# Patient Record
Sex: Female | Born: 1980
Health system: Southern US, Community
[De-identification: ages and names within clinical notes are randomized; demographics above are authoritative.]

## PROBLEM LIST (undated history)

## (undated) HISTORY — PX: DILATION AND CURETTAGE OF UTERUS: SHX78

---

## 2015-03-29 ENCOUNTER — Encounter: Payer: Self-pay | Admitting: *Deleted

## 2015-03-29 ENCOUNTER — Emergency Department
Admission: EM | Admit: 2015-03-29 | Discharge: 2015-03-29 | Disposition: A | Discharge status: Home / Self Care | Payer: 59 | Source: Home / Self Care | Attending: Family Medicine | Admitting: Family Medicine

## 2015-03-29 DIAGNOSIS — J069 Acute upper respiratory infection, unspecified: Secondary | ICD-10-CM

## 2015-03-29 DIAGNOSIS — B9789 Other viral agents as the cause of diseases classified elsewhere: Principal | ICD-10-CM

## 2015-03-29 NOTE — Discharge Instructions (Signed)
Take plain guaifenesin (1200mg extended release tabs such as Mucinex) twice daily, with plenty of water, for cough and congestion.  May add Pseudoephedrine (30mg, one or two every 4 to 6 hours) for sinus congestion.  Get adequate rest.   °May use Afrin nasal spray (or generic oxymetazoline) twice daily for about 5 days and then discontinue.  Also recommend using saline nasal spray several times daily and saline nasal irrigation (AYR is a common brand).  Use Flonase nasal spray each morning after using Afrin nasal spray and saline nasal irrigation. °Try warm salt water gargles for sore throat.  °Stop all antihistamines for now, and other non-prescription cough/cold preparations. °May take Ibuprofen 200mg, 4 tabs every 8 hours with food for chest/sternum discomfort, body aches, etc. °May take Delsym Cough Suppressant at bedtime for nighttime cough.  °Follow-up with family doctor if not improving about10 days.  °

## 2015-03-29 NOTE — ED Notes (Addendum)
Pt c/o 4-5 days of fever, aches sore throat, cough and sneezing. T-max 102. Rec'd flu vac.

## 2015-03-29 NOTE — ED Provider Notes (Signed)
CSN: [id number]     Arrival date & time 03/29/15  1039 History   First MD Initiated Contact with Patient 03/29/15 1144     Chief Complaint  Patient presents with  . Generalized Body Aches  . Fever      HPI Comments: Patient complains of three day history of typical cold-like symptoms including mild sore throat, fever, sinus congestion, headache, fatigue, and cough.   The history is provided by the patient.    History reviewed. No pertinent past medical history. History reviewed. No pertinent past surgical history. Family History  Problem Relation Age of Onset  . Cancer Other   . Diabetes Other    Social History  Substance Use Topics  . Smoking status: Never Smoker   . Smokeless tobacco: Never Used  . Alcohol Use: No   OB History    No data available     Review of Systems + sore throat + cough + sneezing No pleuritic pain No wheezing + nasal congestion + post-nasal drainage No sinus pain/pressure No itchy/red eyes No earache No hemoptysis No SOB + fever, + chills/sweats No nausea No vomiting No abdominal pain No diarrhea No urinary symptoms No skin rash + fatigue + myalgias + headache Used OTC meds without relief  Allergies  Review of patient's allergies indicates no known allergies.  Home Medications   Prior to Admission medications   Not on File   Meds Ordered and Administered this Visit  Medications - No data to display  BP 110/72 mmHg  Pulse 90  Temp(Src) 98.4 F (36.9 C) (Oral)  Resp 18  Wt 245 lb (111.131 kg)  SpO2 98%  LMP 03/15/2015 No data found.   Physical Exam Nursing notes and Vital Signs reviewed. Appearance:  Patient appears stated age, and in no acute distress Eyes:  Pupils are equal, round, and reactive to light and accomodation.  Extraocular movement is intact.  Conjunctivae are not inflamed  Ears:  Canals normal.  Tympanic membranes normal.  Nose:  Congested turbinates.  No sinus tenderness.   Pharynx:  Normal Neck:   Supple.  Tender enlarged posterior nodes are palpated bilaterally  Lungs:  Clear to auscultation.  Breath sounds are equal.  Moving air well.  Chest:  Distinct tenderness to palpation over the mid-sternum.  Heart:  Regular rate and rhythm without murmurs, rubs, or gallops.  Abdomen:  Nontender without masses or hepatosplenomegaly.  Bowel sounds are present.  No CVA or flank tenderness.  Extremities:  No edema.  Skin:  No rash present.   ED Course  Procedures  None    MDM   1. Viral URI with cough    There is no evidence of bacterial infection today.  Treat symptomatically for now  Take plain guaifenesin (1200mg  extended release tabs such as Mucinex) twice daily, with plenty of water, for cough and congestion.  May add Pseudoephedrine (30mg , one or two every 4 to 6 hours) for sinus congestion.  Get adequate rest.   May use Afrin nasal spray (or generic oxymetazoline) twice daily for about 5 days and then discontinue.  Also recommend using saline nasal spray several times daily and saline nasal irrigation (AYR is a common brand).  Use Flonase nasal spray each morning after using Afrin nasal spray and saline nasal irrigation. Try warm salt water gargles for sore throat.  Stop all antihistamines for now, and other non-prescription cough/cold preparations. May take Ibuprofen 200mg , 4 tabs every 8 hours with food for chest/sternum discomfort, body aches, etc.  May take Delsym Cough Suppressant at bedtime for nighttime cough.  Follow-up with family doctor if not improving about10 days.    Lattie Haw, MD 04/02/15 [phone]

## 2015-04-03 ENCOUNTER — Telehealth: Payer: 59 | Admitting: Nurse Practitioner

## 2015-04-03 DIAGNOSIS — J0101 Acute recurrent maxillary sinusitis: Secondary | ICD-10-CM | POA: Diagnosis not present

## 2015-04-03 MED ORDER — AZITHROMYCIN 250 MG PO TABS: ORAL_TABLET | ORAL | Status: DC

## 2015-04-03 MED FILL — AZITHROMYCIN 250 MG TABLET: 250 | 5 days supply | Qty: 6 | Fill #0

## 2015-04-03 NOTE — Progress Notes (Signed)

## 2015-07-30 ENCOUNTER — Emergency Department (INDEPENDENT_AMBULATORY_CARE_PROVIDER_SITE_OTHER): Payer: 59

## 2015-07-30 ENCOUNTER — Encounter: Payer: Self-pay | Admitting: *Deleted

## 2015-07-30 ENCOUNTER — Emergency Department
Admission: EM | Admit: 2015-07-30 | Discharge: 2015-07-30 | Disposition: A | Discharge status: Home / Self Care | Payer: 59 | Source: Home / Self Care | Attending: Emergency Medicine | Admitting: Emergency Medicine

## 2015-07-30 DIAGNOSIS — J040 Acute laryngitis: Secondary | ICD-10-CM | POA: Diagnosis not present

## 2015-07-30 DIAGNOSIS — R0602 Shortness of breath: Secondary | ICD-10-CM | POA: Diagnosis not present

## 2015-07-30 DIAGNOSIS — R05 Cough: Secondary | ICD-10-CM

## 2015-07-30 DIAGNOSIS — R062 Wheezing: Secondary | ICD-10-CM | POA: Diagnosis not present

## 2015-07-30 DIAGNOSIS — J208 Acute bronchitis due to other specified organisms: Secondary | ICD-10-CM

## 2015-07-30 MED ORDER — PREDNISONE 10 MG PO TABS: ORAL_TABLET | ORAL | Status: DC

## 2015-07-30 MED ORDER — AZITHROMYCIN 250 MG PO TABS
250.0000 mg | ORAL_TABLET | Freq: Every day | ORAL | Status: DC
Start: 2015-07-30 — End: 2015-12-19

## 2015-07-30 NOTE — Discharge Instructions (Signed)
Laryngitis  Laryngitis is inflammation of your vocal cords. This causes hoarseness, coughing, loss of voice, sore throat, or a dry throat. Your vocal cords are two bands of muscles that are found in your throat. When you speak, these cords come together and vibrate. These vibrations come out through your mouth as sound. When your vocal cords are inflamed, your voice sounds different.  Laryngitis can be temporary (acute) or long-term (chronic). Most cases of acute laryngitis improve with time. Chronic laryngitis is laryngitis that lasts for more than three weeks.  CAUSES  Acute laryngitis may be caused by:   A viral infection.   Lots of talking, yelling, or singing. This is also called vocal strain.   Bacterial infections.  Chronic laryngitis may be caused by:   Vocal strain.   Injury to your vocal cords.   Acid reflux (gastroesophageal reflux disease or GERD).   Allergies.   Sinus infection.   Smoking.   Alcohol abuse.   Breathing in chemicals or dust.   Growths on the vocal cords.  RISK FACTORS  Risk factors for laryngitis include:   Smoking.   Alcohol abuse.   Having allergies.  SIGNS AND SYMPTOMS  Symptoms of laryngitis may include:   Low, hoarse voice.   Loss of voice.   Dry cough.   Sore throat.   Stuffy nose.  DIAGNOSIS  Laryngitis may be diagnosed by:   Physical exam.   Throat culture.   Blood test.   Laryngoscopy. This procedure allows your health care provider to look at your vocal cords with a mirror or viewing tube.  TREATMENT  Treatment for laryngitis depends on what is causing it. Usually, treatment involves resting your voice and using medicines to soothe your throat. However, if your laryngitis is caused by a bacterial infection, you may need to take antibiotic medicine. If your laryngitis is caused by a growth, you may need to have a procedure to remove it.  HOME CARE INSTRUCTIONS   Drink enough fluid to keep your urine clear or pale yellow.   Breathe in moist air. Use a  humidifier if you live in a dry climate.   Take medicines only as directed by your health care provider.   If you were prescribed an antibiotic medicine, finish it all even if you start to feel better.   Do not smoke cigarettes or electronic cigarettes. If you need help quitting, ask your health care provider.   Talk as little as possible. Also avoid whispering, which can cause vocal strain.   Write instead of talking. Do this until your voice is back to normal.  SEEK MEDICAL CARE IF:   You have a fever.   You have increasing pain.   You have difficulty swallowing.  SEEK IMMEDIATE MEDICAL CARE IF:   You cough up blood.   You have trouble breathing.     This information is not intended to replace advice given to you by your health care provider. Make sure you discuss any questions you have with your health care provider.     Document Released: 03/09/2005 Document Revised: 03/30/2014 Document Reviewed: 08/22/2013  Elsevier Interactive Patient Education 2016 [employer]

## 2015-07-30 NOTE — ED Notes (Signed)
Pt c/o 2 weeks of SOB with exertion, 2-3 days of cough which is now productive. Afebrile. Taken [employer]

## 2015-07-31 NOTE — ED Provider Notes (Signed)
CSN: [id number]     Arrival date & time 07/30/15  1438 History   First MD Initiated Contact with Patient 07/30/15 1514     Chief Complaint  Patient presents with  . Shortness of Breath  . Cough   (Consider location/radiation/quality/duration/timing/severity/associated sxs/prior Treatment) Patient is a 35 y.o. female presenting with shortness of breath and cough. The history is provided by the patient. No language interpreter was used.  Shortness of Breath Severity:  Moderate Onset quality:  Gradual Duration:  2 weeks Timing:  Constant Progression:  Worsening Chronicity:  New Context: activity   Relieved by:  Nothing Worsened by:  Nothing tried Ineffective treatments:  None tried Associated symptoms: cough   Risk factors: no hx of PE/DVT   Cough Associated symptoms: shortness of breath     History reviewed. No pertinent past medical history. History reviewed. No pertinent past surgical history. Family History  Problem Relation Age of Onset  . Cancer Other   . Diabetes Other   . Diabetes Mother    Social History  Substance Use Topics  . Smoking status: Never Smoker   . Smokeless tobacco: Never Used  . Alcohol Use: No   OB History    No data available     Review of Systems  Respiratory: Positive for cough and shortness of breath.   All other systems reviewed and are negative.   Allergies  Review of patient's allergies indicates no known allergies.  Home Medications   Prior to Admission medications   Medication Sig Start Date End Date Taking? Authorizing Provider  azithromycin (ZITHROMAX) 250 MG tablet Take 1 tablet (250 mg total) by mouth daily. Take first 2 tablets together, then 1 every day until finished. 07/30/15   Elson AreasLeslie K Summer Parthasarathy, PA-C  predniSONE (DELTASONE) 10 MG tablet 4,0,9,8,1,16,5,4,3,2,1 taper 07/30/15   Elson AreasLeslie K Arielys [place], PA-C   Meds Ordered and Administered this Visit  Medications - No data to display  BP 104/71 mmHg  Pulse 89  Temp(Src) 98.4 F (36.9 C)  (Oral)  Resp 16  Wt 245 lb (111.131 kg)  SpO2 98%  LMP 07/24/2015 No data found.   Physical Exam  Constitutional: She is oriented to person, place, and time. She appears well-developed and well-nourished.  HENT:  Head: Normocephalic.  Eyes: EOM are normal. Pupils are equal, round, and reactive to light.  Neck: Normal range of motion.  Cardiovascular: Normal rate.   Pulmonary/Chest: Effort normal.  Abdominal: Soft. She exhibits no distension.  Musculoskeletal: Normal range of motion.  Neurological: She is alert and oriented to person, place, and time.  Skin: Skin is warm.  Psychiatric: She has a normal mood and affect.  Nursing note and vitals reviewed.   ED Course  Procedures (including critical care time)  Labs Review Labs Reviewed - No data to display  Imaging Review Dg Chest 2 View  07/30/2015  CLINICAL DATA:  Shortness of breath for 2 weeks. Hoarseness and cough for 2 days. Initial encounter. EXAM: CHEST  2 VIEW COMPARISON:  None. FINDINGS: The lungs are clear. Heart size is normal. No pneumothorax pleural effusion. No focal bony abnormality. IMPRESSION: Negative chest. Electronically Signed   By: Drusilla Kannerhomas  Dalessio M.D.   On: 07/30/2015 15:43     Visual Acuity Review  Right Eye Distance:   Left Eye Distance:   Bilateral Distance:    Right Eye Near:   Left Eye Near:    Bilateral Near:         MDM  Chest xray no pneumonia.  I will treat for bronchitis.  Pt given rx for zithromax and prednisone.   Pt is PERC negative.  I doubt cardiac etiology.  No pe risk.  Pt has raspy voice,  Laryngitis probably due to cough and drainage   1. Laryngitis   2. Acute bronchitis due to other specified organisms     Meds ordered this encounter  Medications  . azithromycin (ZITHROMAX) 250 MG tablet    Sig: Take 1 tablet (250 mg total) by mouth daily. Take first 2 tablets together, then 1 every day until finished.    Dispense:  6 tablet    Refill:  0    Order Specific  Question:  Supervising Provider    Answer:  Georgina Pillion, DAVID [[id number]]  . predniSONE (DELTASONE) 10 MG tablet    Sig: 6,5,4,3,2,1 taper    Dispense:  21 tablet    Refill:  0    Order Specific Question:  Supervising Provider    Answer:  Georgina Pillion, DAVID [[id number]]   An After Visit Summary was printed and given to the patient.   Lonia Skinner [place], PA-C 07/31/15 1836

## 2015-11-26 DIAGNOSIS — H31092 Other chorioretinal scars, left eye: Secondary | ICD-10-CM | POA: Diagnosis not present

## 2015-12-19 ENCOUNTER — Emergency Department
Admission: EM | Admit: 2015-12-19 | Discharge: 2015-12-19 | Disposition: A | Discharge status: Home / Self Care | Payer: 59 | Source: Home / Self Care | Attending: Emergency Medicine | Admitting: Emergency Medicine

## 2015-12-19 ENCOUNTER — Encounter: Payer: Self-pay | Admitting: *Deleted

## 2015-12-19 DIAGNOSIS — B001 Herpesviral vesicular dermatitis: Secondary | ICD-10-CM

## 2015-12-19 DIAGNOSIS — B002 Herpesviral gingivostomatitis and pharyngotonsillitis: Secondary | ICD-10-CM | POA: Diagnosis not present

## 2015-12-19 MED ORDER — VALACYCLOVIR HCL 1 G PO TABS
2000.0000 mg | ORAL_TABLET | Freq: Two times a day (BID) | ORAL | 1 refills | Status: AC
Start: 2015-12-19 — End: ?

## 2015-12-19 NOTE — ED Triage Notes (Signed)
Pt c/o ulcer to right lower gumline x 4-5 days. Pain is starting to radiates down face and into neck. Feels lethargic but otherwise asymptomatic.

## 2015-12-23 DIAGNOSIS — F458 Other somatoform disorders: Secondary | ICD-10-CM | POA: Diagnosis not present

## 2015-12-23 DIAGNOSIS — M2550 Pain in unspecified joint: Secondary | ICD-10-CM | POA: Diagnosis not present

## 2015-12-23 DIAGNOSIS — R748 Abnormal levels of other serum enzymes: Secondary | ICD-10-CM | POA: Diagnosis not present

## 2015-12-23 DIAGNOSIS — R202 Paresthesia of skin: Secondary | ICD-10-CM | POA: Diagnosis not present

## 2015-12-23 DIAGNOSIS — M791 Myalgia: Secondary | ICD-10-CM | POA: Diagnosis not present

## 2015-12-23 DIAGNOSIS — H04129 Dry eye syndrome of unspecified lacrimal gland: Secondary | ICD-10-CM | POA: Diagnosis not present

## 2015-12-23 DIAGNOSIS — Z6841 Body Mass Index (BMI) 40.0 and over, adult: Secondary | ICD-10-CM | POA: Diagnosis not present

## 2015-12-23 DIAGNOSIS — R682 Dry mouth, unspecified: Secondary | ICD-10-CM | POA: Diagnosis not present

## 2015-12-23 DIAGNOSIS — E6609 Other obesity due to excess calories: Secondary | ICD-10-CM | POA: Diagnosis not present

## 2015-12-24 NOTE — ED Provider Notes (Signed)
[facility]    CSN: [id number] Arrival date & time: 12/19/15  1017     History   Chief Complaint Chief Complaint  Patient presents with  . Mouth Lesions    HPI Suzanne Clark is a 35 y.o. female.   HPI Five days  Moderate, lancing  Pain R lower gumline, Radiates to face and right side of neck.History of cold sores in the past, and this feels like a cold sore in the right lower gumline. Otherwise, no sore throat,coryza, fever,chills,chest pain, shortness of breath, abd pain for G.I. Symptoms. No flulike symptoms. No myalgias.No swollen joints. History reviewed. No pertinent past medical history.  There are no active problems to display for this patient.   Past Surgical History:  Procedure Laterality Date  . DILATION AND CURETTAGE OF UTERUS      OB History    No data available       Home Medications    Prior to Admission medications   Medication Sig Start Date End Date Taking? Authorizing Provider  valACYclovir (VALTREX) 1000 MG tablet Take 2 tablets (2,000 mg total) by mouth 2 (two) times daily. For 3 days, for cold sores 12/19/15   Lajean Manes, MD    Family History Family History  Problem Relation Age of Onset  . Cancer Other   . Diabetes Other   . Diabetes Mother     Social History Social History  Substance Use Topics  . Smoking status: Never Smoker  . Smokeless tobacco: Never Used  . Alcohol use No     Allergies   Review of patient's allergies indicates no known allergies.   Review of Systems Review of Systems  All other systems reviewed and are negative.    Physical Exam Triage Vital Signs ED Triage Vitals  Enc Vitals Group     BP 12/19/15 1037 122/76     Pulse Rate 12/19/15 1037 69     Resp --      Temp 12/19/15 1037 98.2 F (36.8 C)     Temp Source 12/19/15 1037 Oral     SpO2 12/19/15 1037 98 %     Weight 12/19/15 1037 247 lb (112 kg)     Height --      Head Circumference --      Peak Flow --      Pain Score  12/19/15 1038 3     Pain Loc --      Pain Edu? --      Excl. in GC? --    No data found.   Updated Vital Signs BP 122/76 (BP Location: Left Arm)   Pulse 69   Temp 98.2 F (36.8 C) (Oral)   Wt 247 lb (112 kg)   LMP 12/03/2015   SpO2 98%   Visual Acuity Right Eye Distance:   Left Eye Distance:   Bilateral Distance:    Right Eye Near:   Left Eye Near:    Bilateral Near:     Physical Exam  Constitutional: She is oriented to person, place, and time. She appears well-developed and well-nourished. No distress.  HENT:  Head: Normocephalic and atraumatic.  Nose: Nose normal. No mucosal edema or rhinorrhea.  Mouth/Throat: Oropharynx is clear and moist. Oral lesions (Ulcers and vesicles,Right lower gingiva) present. No uvula swelling. No posterior oropharyngeal edema, posterior oropharyngeal erythema or tonsillar abscesses. No tonsillar exudate.  Eyes: Conjunctivae and EOM are normal. Pupils are equal, round, and reactive to light. No scleral icterus.  Neck: Normal range  of motion.  Cardiovascular: Normal rate.   Pulmonary/Chest: Effort normal.  Abdominal: She exhibits no distension.  Musculoskeletal: Normal range of motion.  Neurological: She is alert and oriented to person, place, and time.  Skin: Skin is warm.  Psychiatric: She has a normal mood and affect.  Nursing note and vitals reviewed.  Neck:Tender enlarged mobile 1 cm right ant cervical node  UC Treatments / Results  Labs (all labs ordered are listed, but only abnormal results are displayed) Labs Reviewed - No data to display  EKG  EKG Interpretation None       Radiology No results found.  Procedures Procedures (including critical care time)  Medications Ordered in UC Medications - No data to display   Initial Impression / Assessment and Plan / UC Course  I have reviewed the triage vital signs and the nursing notes.  Pertinent labs & imaging results that were available during my care of the patient  were reviewed by me and considered in my medical decision making (see chart for details).  Clinical Course     Final Clinical Impressions(s) / UC Diagnoses   Final diagnoses:  Recurrent cold sores  Primary herpes simplex infection of oral region   Treatment options discussed, as well as risks, benefits, alternatives. Patient voiced understanding and agreement with the following plans:  New Prescriptions Discharge Medication List as of 12/19/2015 11:12 AM    START taking these medications   Details  valACYclovir (VALTREX) 1000 MG tablet Take 2 tablets (2,000 mg total) by mouth 2 (two) times daily. For 3 days, for cold sores, Starting Thu 12/19/2015, Normal      other sxs care discussed. Follow-up with your primary care doctor in 5-7 days if not improving, or sooner if symptoms become worse. Precautions discussed. Red flags discussed. Questions invited and answered. Patient voiced understanding and agreement.    Lajean Manesavid Massey, MD 12/24/15 2030

## 2017-11-07 IMAGING — DX DG CHEST 2V
2 series · 2 of 2 positions shown · non-contrast
Comparison: None.

CLINICAL DATA: Shortness of breath for 2 weeks. Hoarseness and
cough for 2 days. Initial encounter.

EXAM:
CHEST  2 VIEW

[chest pa]
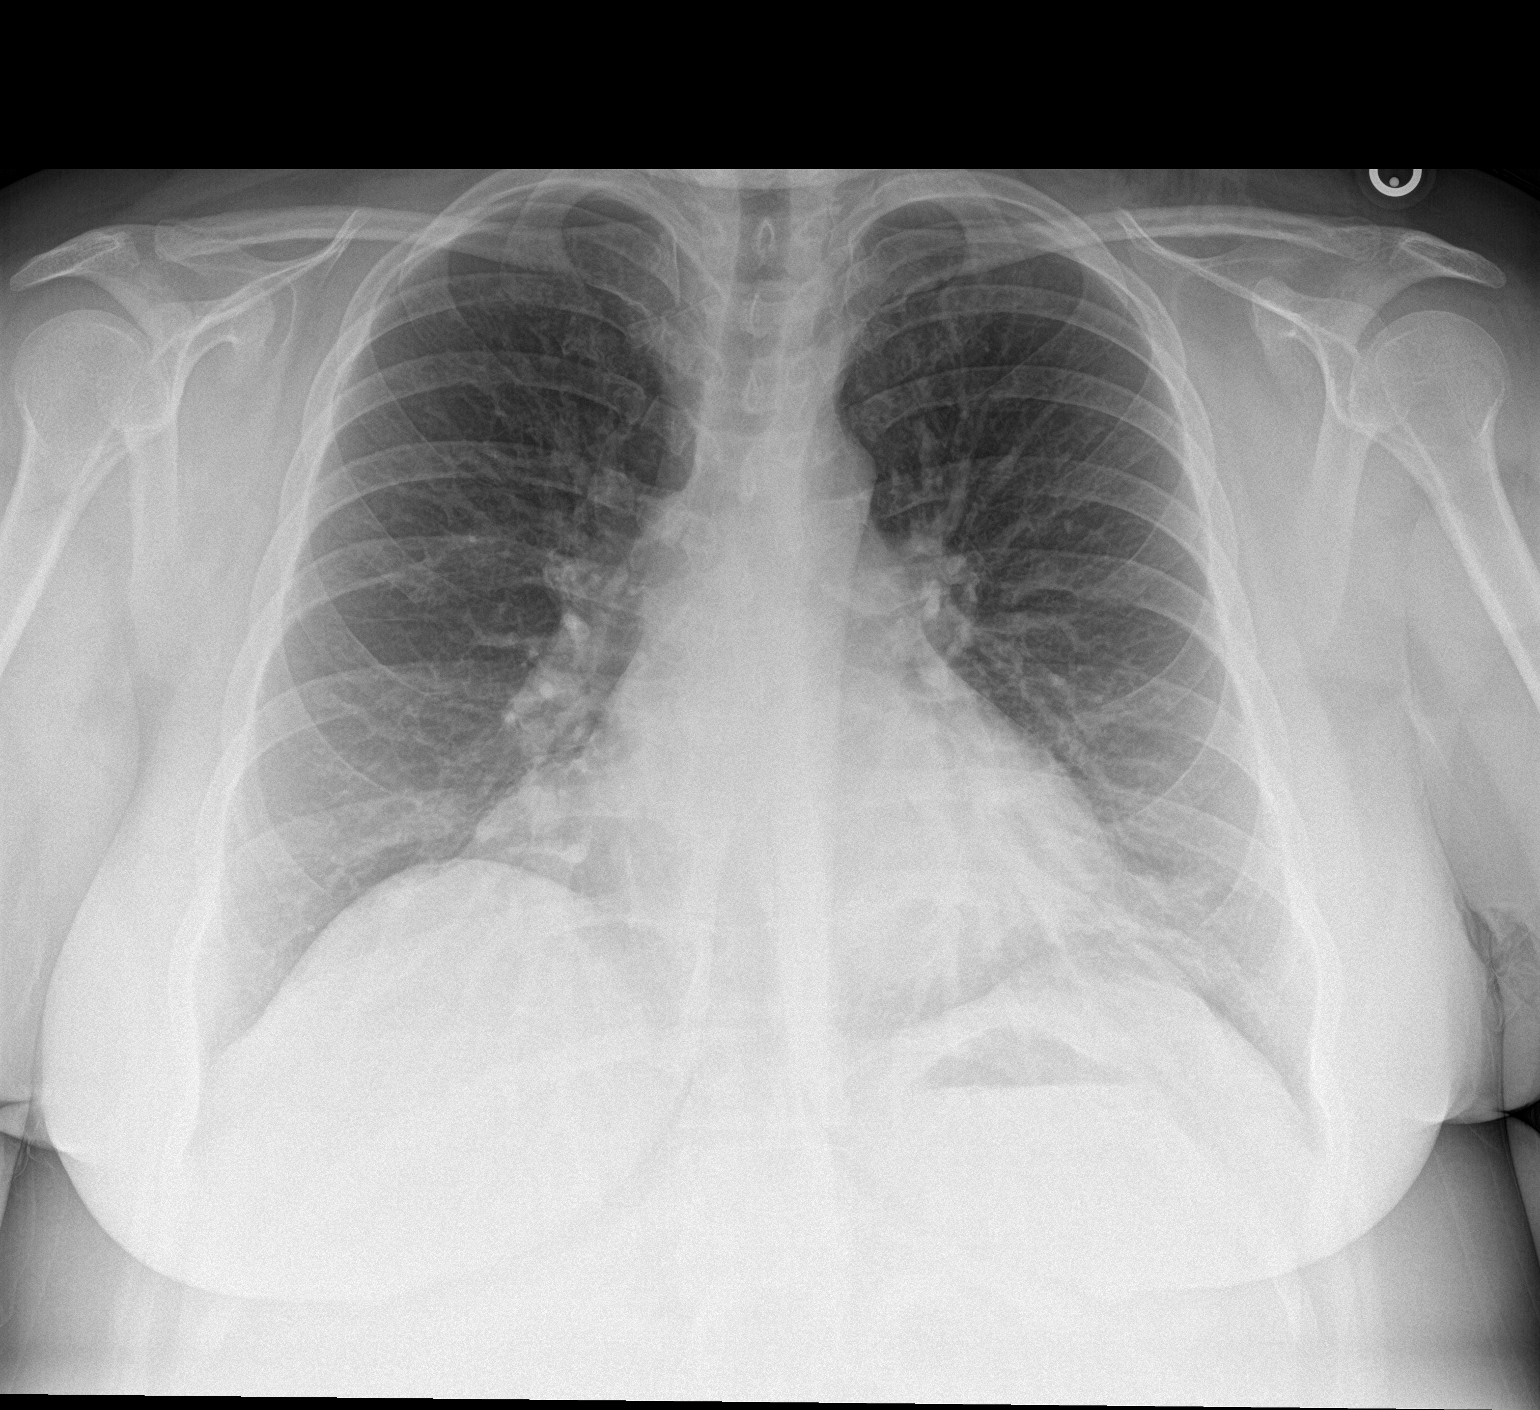

[chest lat]
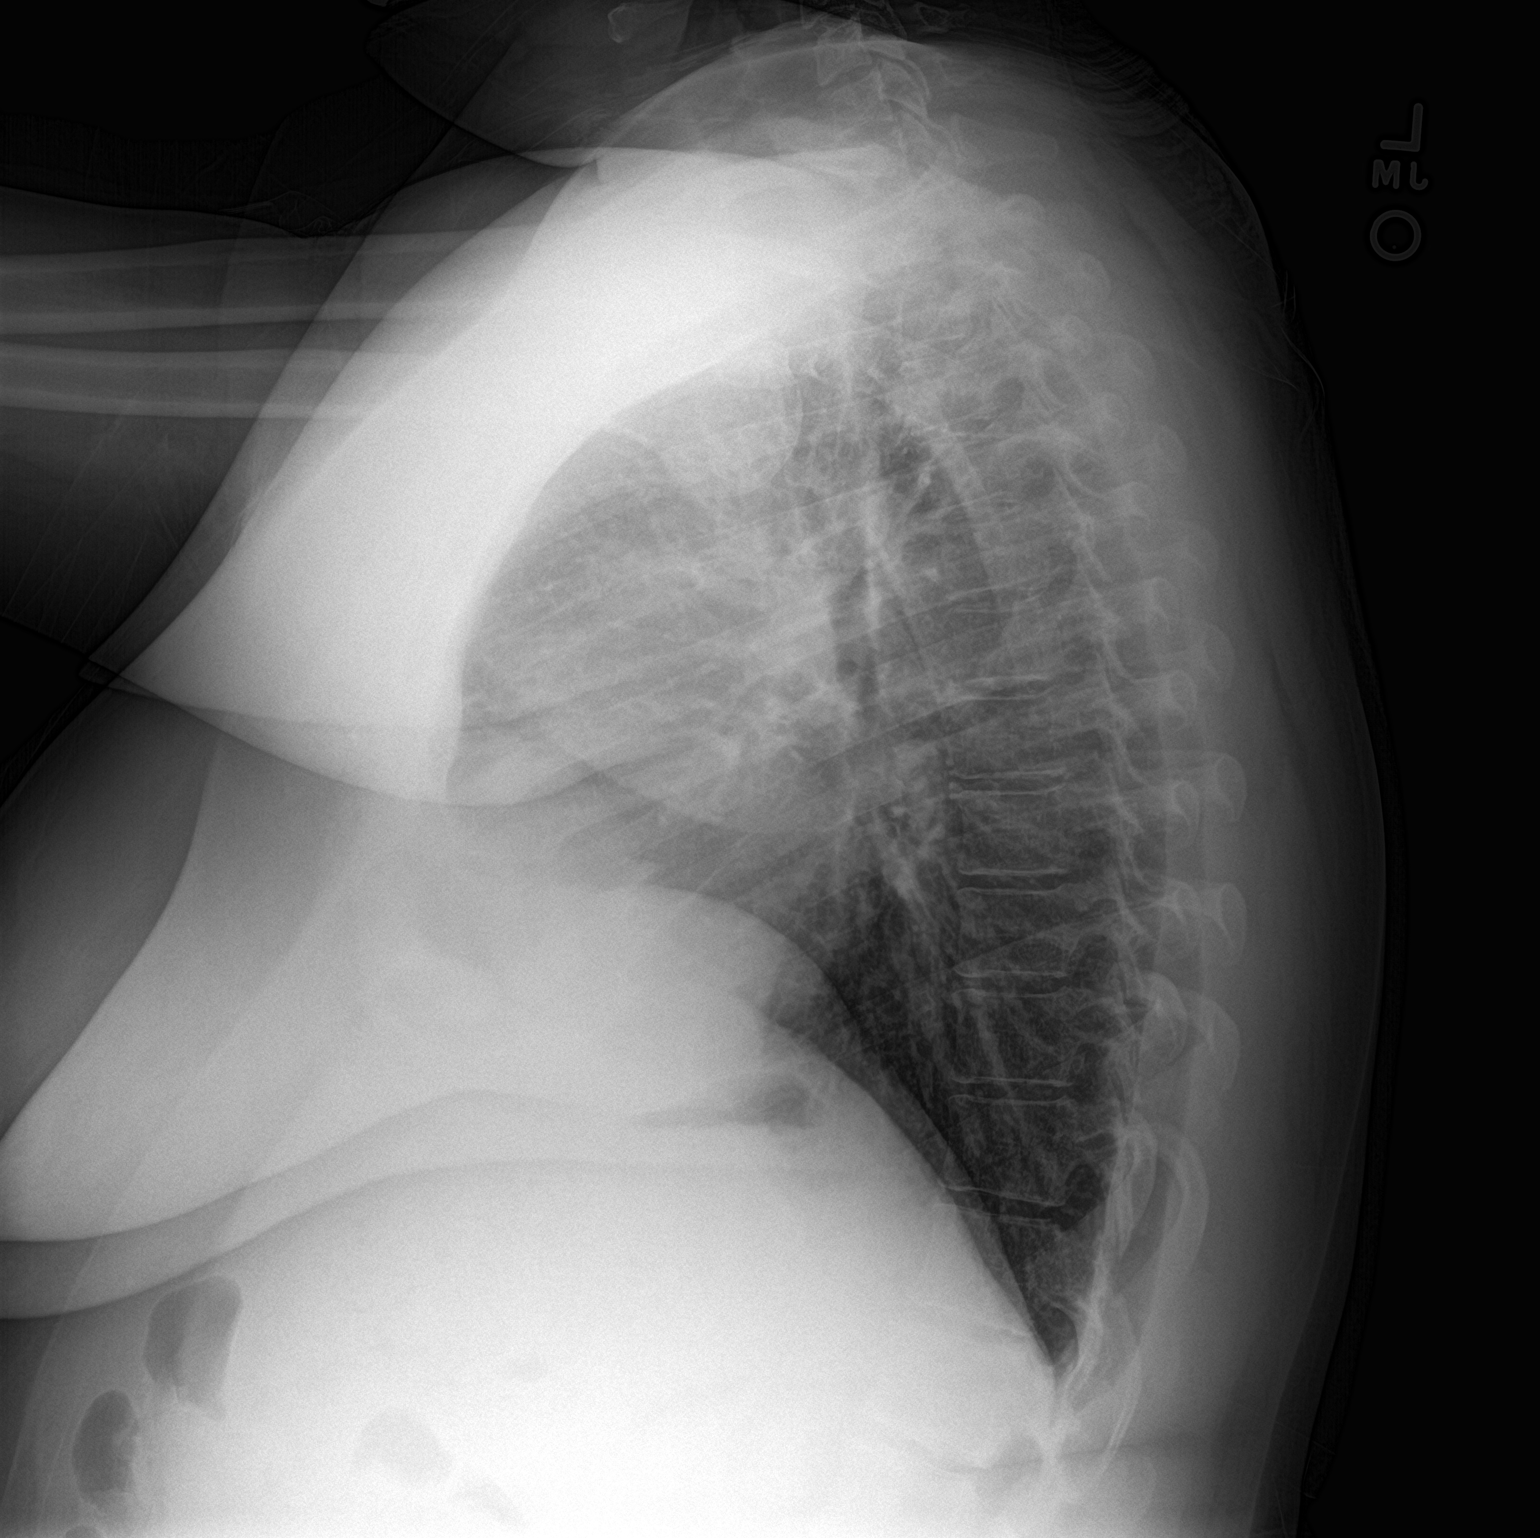

[2 of 2 positions shown; findings below may reference images not displayed]

FINDINGS: The lungs are clear. Heart size is normal. No pneumothorax pleural
effusion. No focal bony abnormality.
IMPRESSION: Negative chest.
